# Patient Record
Sex: Male | Born: 1990
Health system: Southern US, Community
[De-identification: ages and names within clinical notes are randomized; demographics above are authoritative.]

## PROBLEM LIST (undated history)

## (undated) HISTORY — PX: WISDOM TOOTH EXTRACTION: SHX21

---
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
## 2014-01-18 ENCOUNTER — Emergency Department: Payer: Self-pay | Admitting: Emergency Medicine

## 2016-05-02 ENCOUNTER — Other Ambulatory Visit: Payer: Self-pay | Admitting: Occupational Medicine

## 2016-05-02 ENCOUNTER — Ambulatory Visit
Admission: RE | Admit: 2016-05-02 | Discharge: 2016-05-02 | Disposition: A | Discharge status: Home / Self Care | Payer: Self-pay | Source: Ambulatory Visit | Attending: Occupational Medicine | Admitting: Occupational Medicine

## 2016-05-02 DIAGNOSIS — Z021 Encounter for pre-employment examination: Secondary | ICD-10-CM

## 2016-05-22 ENCOUNTER — Ambulatory Visit
Admission: RE | Admit: 2016-05-22 | Discharge: 2016-05-22 | Disposition: A | Discharge status: Home / Self Care | Payer: BLUE CROSS/BLUE SHIELD | Source: Ambulatory Visit | Attending: Family Medicine | Admitting: Family Medicine

## 2016-05-22 ENCOUNTER — Other Ambulatory Visit: Payer: Self-pay | Admitting: Family Medicine

## 2016-05-22 DIAGNOSIS — M25532 Pain in left wrist: Secondary | ICD-10-CM

## 2017-02-20 DIAGNOSIS — J358 Other chronic diseases of tonsils and adenoids: Secondary | ICD-10-CM | POA: Diagnosis not present

## 2017-03-13 NOTE — Discharge Instructions (Signed)
T & A INSTRUCTION SHEET - [facility] °[facility] ° °CREIGHTON VAUGHT, MD °PAUL H. JUENGEL, MD  °P. SCOTT BENNETT °CHAPMAN MCQUEEN, MD ° °[address] [place], [place] [address] TEL. [phone] °[address] [place] [address] [phone] ° °INFORMATION SHEET FOR A TONSILLECTOMY AND ADENDOIDECTOMY ° °About Your Tonsils and Adenoids ° The tonsils and adenoids are normal body tissues that are part of our immune system.  They normally help to protect us against diseases that may enter our mouth and nose.  However, sometimes the tonsils and/or adenoids become too large and obstruct our breathing, especially at night. °  ° If either of these things happen it helps to remove the tonsils and adenoids in order to become healthier. The operation to remove the tonsils and adenoids is called a tonsillectomy and adenoidectomy. ° °The Location of Your Tonsils and Adenoids ° The tonsils are located in the back of the throat on both side and sit in a cradle of muscles. The adenoids are located in the roof of the mouth, behind the nose, and closely associated with the opening of the Eustachian tube to the ear. ° °Surgery on Tonsils and Adenoids ° A tonsillectomy and adenoidectomy is a short operation which takes about thirty minutes.  This includes being put to sleep and being awakened.  Tonsillectomies and adenoidectomies are performed at [facility] and may require observation period in the recovery room prior to going home. ° °Following the Operation for a Tonsillectomy ° A cautery machine is used to control bleeding.  Bleeding from a tonsillectomy and adenoidectomy is minimal and postoperatively the risk of bleeding is approximately four percent, although this rarely life threatening. ° ° ° °After your tonsillectomy and adenoidectomy post-op care at home: ° °1. Our patients are able to go home the same day.  You may be given prescriptions for pain  medications and antibiotics, if indicated. °2. It is extremely important to remember that fluid intake is of utmost importance after a tonsillectomy.  The amount that you drink must be maintained in the postoperative period.  A good indication of whether a child is getting enough fluid is whether his/her urine output is constant.  As long as children are urinating or wetting their diaper every 6 - 8 hours this is usually enough fluid intake.   °3. Although rare, this is a risk of some bleeding in the first ten days after surgery.  This is usually occurs between day five and nine postoperatively.  This risk of bleeding is approximately four percent.  If you or your child should have any bleeding you should remain calm and notify our office or go directly to the Emergency Room at [facility] where they will contact us. Our doctors are available seven days a week for notification.  We recommend sitting up quietly in a chair, place an ice pack on the front of the neck and spitting out the blood gently until we are able to contact you.  Adults should gargle gently with ice water and this may help stop the bleeding.  If the bleeding does not stop after a short time, i.e. 10 to 15 minutes, or seems to be increasing again, please contact us or go to the hospital.   °4. It is common for the pain to be worse at 5 - 7 days postoperatively.  This occurs because the “scab” is peeling off and the mucous membrane (skin of   the throat) is growing back where the tonsils were.   °5. It is common for a low-grade fever, less than 102, during the first week after a tonsillectomy and adenoidectomy.  It is usually due to not drinking enough liquids, and we suggest your use liquid Tylenol or the pain medicine with Tylenol prescribed in order to keep your temperature below 102.  Please follow the directions on the back of the bottle. °6. Do not take aspirin or any products that contain aspirin such as Bufferin, Anacin,  Ecotrin, aspirin gum, Goodies, BC headache powders, etc., after a T&A because it can promote bleeding.  Please check with our office before administering any other medication that may been prescribed by other doctors during the two week post-operative period. °7. If you happen to look in the mirror or into your child’s mouth you will see white/gray patches on the back of the throat.  This is what a scab looks like in the mouth and is normal after having a T&A.  It will disappear once the tonsil area heals completely. However, it may cause a noticeable odor, and this too will disappear with time.     °8. You or your child may experience ear pain after having a T&A.  This is called referred pain and comes from the throat, but it is felt in the ears.  Ear pain is quite common and expected.  It will usually go away after ten days.  There is usually nothing wrong with the ears, and it is primarily due to the healing area stimulating the nerve to the ear that runs along the side of the throat.  Use either the prescribed pain medicine or Tylenol as needed.  °9. The throat tissues after a tonsillectomy are obviously sensitive.  Smoking around children who have had a tonsillectomy significantly increases the risk of bleeding.  DO NOT SMOKE!  ° °General Anesthesia, Adult, Care After °These instructions provide you with information about caring for yourself after your procedure. Your health care provider may also give you more specific instructions. Your treatment has been planned according to current medical practices, but problems sometimes occur. Call your health care provider if you have any problems or questions after your procedure. °What can I expect after the procedure? °After the procedure, it is common to have: °· Vomiting. °· A sore throat. °· Mental slowness. ° °It is common to feel: °· Nauseous. °· Cold or shivery. °· Sleepy. °· Tired. °· Sore or achy, even in parts of your body where you did not have  surgery. ° °Follow these instructions at home: °For at least 24 hours after the procedure: °· Do not: °? Participate in activities where you could fall or become injured. °? Drive. °? Use heavy machinery. °? Drink alcohol. °? Take sleeping pills or medicines that cause drowsiness. °? Make important decisions or sign legal documents. °? Take care of children on your own. °· Rest. °Eating and drinking °· If you vomit, drink water, juice, or soup when you can drink without vomiting. °· Drink enough fluid to keep your urine clear or pale yellow. °· Make sure you have little or no nausea before eating solid foods. °· Follow the diet recommended by your health care provider. °General instructions °· Have a responsible adult stay with you until you are awake and alert. °· Return to your normal activities as told by your health care provider. Ask your health care provider what activities are safe for you. °· Take over-the-counter and   prescription medicines only as told by your health care provider. °· If you smoke, do not smoke without supervision. °· Keep all follow-up visits as told by your health care provider. This is important. °Contact a health care provider if: °· You continue to have nausea or vomiting at home, and medicines are not helpful. °· You cannot drink fluids or start eating again. °· You cannot urinate after 8-12 hours. °· You develop a skin rash. °· You have fever. °· You have increasing redness at the site of your procedure. °Get help right away if: °· You have difficulty breathing. °· You have chest pain. °· You have unexpected bleeding. °· You feel that you are having a life-threatening or urgent problem. °This information is not intended to replace advice given to you by your health care provider. Make sure you discuss any questions you have with your health care provider. °Document Released: 09/04/2000 Document Revised: 11/01/2015 Document Reviewed: 05/13/2015 °Elsevier Interactive Patient Education  © 2018 [employer] ° °

## 2017-03-16 ENCOUNTER — Ambulatory Visit
Admission: RE | Admit: 2017-03-16 | Discharge: 2017-03-16 | Disposition: A | Discharge status: Home / Self Care | Payer: 59 | Source: Ambulatory Visit | Attending: Unknown Physician Specialty | Admitting: Unknown Physician Specialty

## 2017-03-16 ENCOUNTER — Ambulatory Visit: Payer: 59 | Admitting: Anesthesiology

## 2017-03-16 ENCOUNTER — Encounter
Admission: RE | Disposition: A | Discharge status: Home / Self Care | Payer: Self-pay | Source: Ambulatory Visit | Attending: Unknown Physician Specialty

## 2017-03-16 DIAGNOSIS — J358 Other chronic diseases of tonsils and adenoids: Secondary | ICD-10-CM | POA: Diagnosis present

## 2017-03-16 DIAGNOSIS — J3501 Chronic tonsillitis: Secondary | ICD-10-CM | POA: Insufficient documentation

## 2017-03-16 DIAGNOSIS — J039 Acute tonsillitis, unspecified: Secondary | ICD-10-CM | POA: Diagnosis not present

## 2017-03-16 DIAGNOSIS — J351 Hypertrophy of tonsils: Secondary | ICD-10-CM | POA: Diagnosis not present

## 2017-03-16 DIAGNOSIS — A4289 Other forms of actinomycosis: Secondary | ICD-10-CM | POA: Diagnosis not present

## 2017-03-16 HISTORY — PX: TONSILLECTOMY: SHX5217

## 2017-03-16 SURGERY — TONSILLECTOMY
Anesthesia: General | Laterality: Bilateral | Wound class: Clean Contaminated

## 2017-03-16 MED ORDER — MIDAZOLAM HCL 2 MG/2ML IJ SOLN
INTRAMUSCULAR | Status: DC | PRN
  Administered 2017-03-16: 2 mg via INTRAVENOUS

## 2017-03-16 MED ORDER — LIDOCAINE HCL (CARDIAC) 20 MG/ML IV SOLN
INTRAVENOUS | Status: DC | PRN
  Administered 2017-03-16: 50 mg via INTRATRACHEAL

## 2017-03-16 MED ORDER — OXYCODONE HCL 5 MG PO TABS
5.0000 mg | ORAL_TABLET | Freq: Once | ORAL | Status: AC | PRN
Start: 2017-03-16 — End: 2017-03-16

## 2017-03-16 MED ORDER — HYDROCODONE-ACETAMINOPHEN 7.5-325 MG/15ML PO SOLN: 15.0000 mL | Freq: Four times a day (QID) | ORAL | 0 refills | Status: DC | PRN

## 2017-03-16 MED ORDER — FENTANYL CITRATE (PF) 100 MCG/2ML IJ SOLN
25.0000 ug | INTRAMUSCULAR | Status: DC | PRN
Start: 2017-03-16 — End: 2017-03-16

## 2017-03-16 MED ORDER — OXYCODONE HCL 5 MG/5ML PO SOLN
5.0000 mg | Freq: Once | ORAL | Status: AC | PRN
  Administered 2017-03-16: 5 mg via ORAL

## 2017-03-16 MED ORDER — PROPOFOL 10 MG/ML IV BOLUS
INTRAVENOUS | Status: DC | PRN
  Administered 2017-03-16: 200 mg via INTRAVENOUS

## 2017-03-16 MED ORDER — LACTATED RINGERS IV SOLN
INTRAVENOUS | Status: DC
  Administered 2017-03-16: 09:00:00 via INTRAVENOUS

## 2017-03-16 MED ORDER — SCOPOLAMINE 1 MG/3DAYS TD PT72
1.0000 | MEDICATED_PATCH | TRANSDERMAL | Status: DC
  Administered 2017-03-16: 1.5 mg via TRANSDERMAL

## 2017-03-16 MED ORDER — ACETAMINOPHEN 10 MG/ML IV SOLN
1000.0000 mg | Freq: Four times a day (QID) | INTRAVENOUS | Status: DC
  Administered 2017-03-16: 1000 mg via INTRAVENOUS

## 2017-03-16 MED ORDER — FENTANYL CITRATE (PF) 100 MCG/2ML IJ SOLN
INTRAMUSCULAR | Status: DC | PRN
  Administered 2017-03-16: 100 ug via INTRAVENOUS

## 2017-03-16 MED ORDER — DEXAMETHASONE SODIUM PHOSPHATE 4 MG/ML IJ SOLN
INTRAMUSCULAR | Status: DC | PRN
  Administered 2017-03-16: 10 mg via INTRAVENOUS

## 2017-03-16 MED ORDER — SUCCINYLCHOLINE CHLORIDE 20 MG/ML IJ SOLN
INTRAMUSCULAR | Status: DC | PRN
  Administered 2017-03-16: 100 mg via INTRAVENOUS

## 2017-03-16 MED ORDER — BUPIVACAINE HCL (PF) 0.5 % IJ SOLN
INTRAMUSCULAR | Status: DC | PRN
  Administered 2017-03-16: 9 mL

## 2017-03-16 MED ORDER — ONDANSETRON HCL 4 MG/2ML IJ SOLN
INTRAMUSCULAR | Status: DC | PRN
  Administered 2017-03-16: 4 mg via INTRAVENOUS

## 2017-03-16 SURGICAL SUPPLY — 17 items
CANISTER SUCT 1200ML W/VALVE (MISCELLANEOUS) ×2 IMPLANT
COAG SUCT 10F 3.5MM HAND CTRL (MISCELLANEOUS) ×2 IMPLANT
DRAPE HEAD BAR (DRAPES) ×2 IMPLANT
ELECT CAUTERY BLADE TIP 2.5 (TIP) ×2
ELECTRODE CAUTERY BLDE TIP 2.5 (TIP) ×1 IMPLANT
GLOVE BIO SURGEON STRL SZ7.5 (GLOVE) ×2 IMPLANT
HANDLE SUCTION POOLE (INSTRUMENTS) ×1 IMPLANT
KIT ROOM TURNOVER OR (KITS) ×2 IMPLANT
NEEDLE HYPO 25GX1X1/2 BEV (NEEDLE) ×2 IMPLANT
NS IRRIG 500ML POUR BTL (IV SOLUTION) ×2 IMPLANT
PACK TONSIL/ADENOIDS (PACKS) ×2 IMPLANT
PAD GROUND ADULT SPLIT (MISCELLANEOUS) ×2 IMPLANT
PENCIL ELECTRO HAND CTR (MISCELLANEOUS) ×2 IMPLANT
STRAP BODY AND KNEE 60X3 (MISCELLANEOUS) ×2 IMPLANT
SUCTION POOLE HANDLE (INSTRUMENTS) ×2
SYR 5ML LL (SYRINGE) ×2 IMPLANT
SYRINGE 10CC LL (SYRINGE) IMPLANT

## 2017-03-16 NOTE — Transfer of Care (Signed)
Immediate Anesthesia Transfer of Care Note  Patient: Andrew Montoya  Procedure(s) Performed: TONSILLECTOMY (Bilateral )  Patient Location: PACU  Anesthesia Type: General ETT  Level of Consciousness: awake, alert  and patient cooperative  Airway and Oxygen Therapy: Patient Spontanous Breathing and Patient connected to supplemental oxygen  Post-op Assessment: Post-op Vital signs reviewed, Patient's Cardiovascular Status Stable, Respiratory Function Stable, Patent Airway and No signs of Nausea or vomiting  Post-op Vital Signs: Reviewed and stable  Complications: No apparent anesthesia complications

## 2017-03-16 NOTE — Anesthesia Preprocedure Evaluation (Signed)
Anesthesia Evaluation  Patient identified by MRN, date of birth, ID band Patient awake    Airway Mallampati: I  TM Distance: >3 FB Neck ROM: full    Dental no notable dental hx.    Pulmonary former smoker,    Pulmonary exam normal        Cardiovascular negative cardio ROS Normal cardiovascular exam     Neuro/Psych    GI/Hepatic negative GI ROS, Neg liver ROS,   Endo/Other  negative endocrine ROS  Renal/GU negative Renal ROS     Musculoskeletal   Abdominal   Peds  Hematology negative hematology ROS (+)   Anesthesia Other Findings   Reproductive/Obstetrics                            Anesthesia Physical Anesthesia Plan  ASA: I  Anesthesia Plan: General ETT   Post-op Pain Management:    Induction:   PONV Risk Score and Plan:   Airway Management Planned:   Additional Equipment:   Intra-op Plan:   Post-operative Plan:   Informed Consent: I have reviewed the patients History and Physical, chart, labs and discussed the procedure including the risks, benefits and alternatives for the proposed anesthesia with the patient or authorized representative who has indicated his/her understanding and acceptance.     Plan Discussed with:   Anesthesia Plan Comments:         Anesthesia Quick Evaluation

## 2017-03-16 NOTE — H&P (Signed)
The patient's history has been reviewed, patient examined, no change in status, stable for surgery.  Questions were answered to the patients satisfaction.  

## 2017-03-16 NOTE — Anesthesia Postprocedure Evaluation (Signed)
Anesthesia Post Note  Patient: Andrew Montoya  Procedure(s) Performed: TONSILLECTOMY (Bilateral )  Patient location during evaluation: PACU Anesthesia Type: General Level of consciousness: awake and alert Pain management: pain level controlled Vital Signs Assessment: post-procedure vital signs reviewed and stable Respiratory status: spontaneous breathing Cardiovascular status: blood pressure returned to baseline Postop Assessment: no headache Anesthetic complications: no    Jaci Standard, III,  Romualdo Prosise D

## 2017-03-16 NOTE — Anesthesia Procedure Notes (Signed)
Procedure Name: Intubation Date/Time: 03/16/2017 9:54 AM Performed by: Londell Moh Pre-anesthesia Checklist: Patient identified, Emergency Drugs available, Suction available, Patient being monitored and Timeout performed Patient Re-evaluated:Patient Re-evaluated prior to induction Oxygen Delivery Method: Circle system utilized Preoxygenation: Pre-oxygenation with 100% oxygen Induction Type: IV induction Ventilation: Mask ventilation without difficulty Laryngoscope Size: Mac and 3 Grade View: Grade I Tube type: Oral Rae Tube size: 7.5 mm Number of attempts: 1 Placement Confirmation: ETT inserted through vocal cords under direct vision,  positive ETCO2 and breath sounds checked- equal and bilateral Tube secured with: Tape Dental Injury: Teeth and Oropharynx as per pre-operative assessment

## 2017-03-16 NOTE — Op Note (Signed)
PREOPERATIVE DIAGNOSIS:  TONSILLOLITHIASIS  TONSILLITIS  POSTOPERATIVE DIAGNOSIS:  Chronic Tonsillitis  OPERATION:  Tonsillectomy.  SURGEON:  Roena Malady, MD  ANESTHESIA:  General endotracheal.  OPERATIVE FINDINGS:  Large tonsils.  DESCRIPTION OF THE PROCEDURE: AESON SAWYERS was identified in the holding area and taken to the operating room and placed in the supine position.  After general endotracheal anesthesia, the table was turned 45 degrees and the patient was draped in the usual fashion for a tonsillectomy.  A mouth gag was inserted into the oral cavity.  There were large tonsils.  Beginning on the left-hand side a tenaculum was used to grasp the tonsil and the Bovie cautery was used to dissect it free from the fossa.  In a similar fashion, the right tonsil was removed.  Meticulous hemostasis was achieved using the Bovie cautery.  With both tonsils removed and no active bleeding, 0.5% plain Marcaine was used to inject the anterior and posterior tonsillar pillars bilaterally.  A total of 67ml was used.  The patient tolerated the procedure well and was awakened in the operating room and taken to the recovery room in stable condition.   CULTURES:  None.  SPECIMENS:  Tonsils.  ESTIMATED BLOOD LOSS:  Less than 10 ml.  Andrew Montoya T  03/16/2017  10:11 AM

## 2017-03-20 LAB — SURGICAL PATHOLOGY

## 2017-11-08 ENCOUNTER — Ambulatory Visit: Payer: 59 | Admitting: Physician Assistant

## 2017-11-08 ENCOUNTER — Ambulatory Visit
Admission: RE | Admit: 2017-11-08 | Discharge: 2017-11-08 | Disposition: A | Discharge status: Home / Self Care | Payer: 59 | Source: Ambulatory Visit | Attending: Physician Assistant | Admitting: Physician Assistant

## 2017-11-08 ENCOUNTER — Encounter: Payer: Self-pay | Admitting: Physician Assistant

## 2017-11-08 VITALS — BP 116/80 | HR 76 | Temp 97.9°F | Resp 16 | Ht 70.0 in | Wt 216.0 lb

## 2017-11-08 DIAGNOSIS — M79672 Pain in left foot: Secondary | ICD-10-CM | POA: Insufficient documentation

## 2017-11-08 DIAGNOSIS — M25562 Pain in left knee: Secondary | ICD-10-CM

## 2017-11-08 DIAGNOSIS — M7541 Impingement syndrome of right shoulder: Secondary | ICD-10-CM | POA: Diagnosis not present

## 2017-11-08 MED ORDER — METHYLPREDNISOLONE 4 MG PO TBPK: ORAL_TABLET | ORAL | 0 refills | Status: DC

## 2017-11-08 NOTE — Progress Notes (Signed)
Patient: Andrew Montoya, Male    DOB: 1991/05/23, 27 y.o.   MRN: [id number] Visit Date: 11/08/2017  Today's Provider: Mar Daring, PA-C   Chief Complaint  Patient presents with  . New Patient (Initial Visit)   Subjective:   Patient here to establish care. Patient reports that he was been seen by the [facility] doctor.   Andrew Montoya is a 27 y.o. male who presents today for establishing careyHe feels well. He reports exercising 5 days a week. He reports he is sleeping well. 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 He does still live locally with his girlfriend. No children.   He has mulitple complaints today. He complains of right shoulder pain, mostly with overhead activities. No known injury.  He also has pain of his right wrist with wrist extension. No other movements bother him. No known injury.   He also has pain in his left knee and left foot. These are the most bothersome. Again no known injury noted.  -----------------------------------------------------------------   Review of Systems  Constitutional: Negative.   HENT: Positive for sinus pressure.   Eyes: Negative.   Respiratory: Negative.   Cardiovascular: Negative.   Gastrointestinal: Negative.   Endocrine: Negative.   Genitourinary: Negative.   Musculoskeletal: Positive for arthralgias and myalgias.  Skin: Negative.   Allergic/Immunologic: Negative.   Neurological: Negative.   Hematological: Negative.   Psychiatric/Behavioral: Negative.     Social History      He  reports that he has never smoked. He quit smokeless tobacco use about 7 months ago. He reports that he drinks about 1.2 oz of alcohol per week.       Social History   Socioeconomic History  . Marital status: Single    Spouse name: Not on file  . Number of children: Not on file  . Years of education: Not on file  . Highest education level: Not on file  Occupational History  . Not on file  Social Needs  .  Financial resource strain: Not on file  . Food insecurity:    Worry: Not on file    Inability: Not on file  . Transportation needs:    Medical: Not on file    Non-medical: Not on file  Tobacco Use  . Smoking status: Never Smoker  . Smokeless tobacco: Former Network engineer and Sexual Activity  . Alcohol use: Yes    Alcohol/week: 1.2 oz    Types: 2 Cans of beer per week    Comment: 2-3x week  . Drug use: Not on file  . Sexual activity: Not on file  Lifestyle  . Physical activity:    Days per week: Not on file    Minutes per session: Not on file  . Stress: Not on file  Relationships  . Social connections:    Talks on phone: Not on file    Gets together: Not on file    Attends religious service: Not on file    Active member of club or organization: Not on file    Attends meetings of clubs or organizations: Not on file    Relationship status: Not on file  Other Topics Concern  . Not on file  Social History Narrative  . Not on file    No past medical history on file.   There are no active problems to display for this patient.   Past Surgical History:  Procedure Laterality Date  . TONSILLECTOMY Bilateral  03/16/2017   Procedure: TONSILLECTOMY;  Surgeon: Beverly Gust, MD;  Location: [place];  Service: ENT;  Laterality: Bilateral;  . WISDOM TOOTH EXTRACTION      Family History        Family Status  Relation Name Status  . Mother  Alive  . Father  Alive  . PGM  Alive  . PGF  Alive  . Sister  Alive  . Brother  Alive        His family history includes Diabetes in his paternal grandmother; Epilepsy in his brother; Hypertension in his father and paternal grandfather.      No Known Allergies   Current Outpatient Medications:  Marland Kitchen  Multiple Vitamins-Minerals (MENS MULTIVITAMIN PLUS PO), Take by mouth., Disp: , Rfl:    Patient Care Team: Karen Kitchens, MD as PCP - General (Family Medicine)      Objective:   Vitals: BP 116/80 (BP Location:  Left Arm, Patient Position: Sitting, Cuff Size: Large)   Pulse 76   Temp 97.9 F (36.6 C) (Oral)   Resp 16   Ht 5\' 10"  (1.778 m)   Wt 216 lb (98 kg)   SpO2 97%   BMI 30.99 kg/m    Vitals:   11/08/17 1413  BP: 116/80  Pulse: 76  Resp: 16  Temp: 97.9 F (36.6 C)  TempSrc: Oral  SpO2: 97%  Weight: 216 lb (98 kg)  Height: 5\' 10"  (1.778 m)     Physical Exam  Constitutional: He appears well-developed and well-nourished. No distress.  HENT:  Head: Normocephalic and atraumatic.  Neck: Normal range of motion. Neck supple. No tracheal deviation present.  Cardiovascular: Normal rate, regular rhythm and normal heart sounds. Exam reveals no gallop and no friction rub.  No murmur heard. Pulmonary/Chest: Effort normal and breath sounds normal. No respiratory distress. He has no wheezes. He has no rales.  Musculoskeletal:       Right shoulder: He exhibits crepitus and pain. He exhibits normal range of motion, no tenderness, no spasm, normal pulse and normal strength.       Right wrist: He exhibits normal range of motion, no tenderness, no bony tenderness, no crepitus and no deformity.       Left knee: He exhibits normal range of motion, no swelling, no effusion, no erythema, normal alignment, no LCL laxity, normal patellar mobility, no bony tenderness, normal meniscus and no MCL laxity. No tenderness found.       Left ankle: Normal.       Left foot: There is bony tenderness. There is normal range of motion, no swelling and normal capillary refill.  Pain only with AROM of the right shoulder. Most with abduction of the right shoulder and IR.  Right wrist only pain with AROM of wrist extension. Most notably over the extensor tendons.  Pain of left knee could not be recreated. Possible Baker Cyst as pain is located in the popliteal region  Left foot pain with weight bearing most notably and point tender over the cuboid bone  Skin: He is not diaphoretic.  Vitals reviewed.    Depression  Screen PHQ 2/9 Scores 11/08/2017  PHQ - 2 Score 1  PHQ- 9 Score 4      Assessment & Plan:     Routine Health Maintenance and Physical Exam  Exercise Activities and Dietary recommendations Goals    None       There is no immunization history on file for this patient.  Health Maintenance  Topic  Date Due  . HIV Screening  03/06/2006  . TETANUS/TDAP  03/06/2010  . INFLUENZA VACCINE  01/10/2018     Discussed health benefits of physical activity, and encouraged him to engage in regular exercise appropriate for his age and condition.    1. Acute pain of left knee Will obtain imaging as below to r/o bony abnormality and see if baker cyst may be visualized. Medrol dose pak given as below. Advised to brace knee, ice after activity, heat and stretch. Call if no improvements.  - DG Knee Complete 4 Views Left; Future - methylPREDNISolone (MEDROL) 4 MG TBPK tablet; 6 day taper; take as directed on package instructions  Dispense: 21 tablet; Refill: 0  2. Left foot pain Will get imaging as below to r/o possible stress fracture. 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 Again will use medrol dose pak as noted previously. - DG Foot Complete Left; Future - methylPREDNISolone (MEDROL) 4 MG TBPK tablet; 6 day taper; take as directed on package instructions  Dispense: 21 tablet; Refill: 0  3. Impingement syndrome of right shoulder Exercises printed for impingement syndrome. Medrol given as below.  - methylPREDNISolone (MEDROL) 4 MG TBPK tablet; 6 day taper; take as directed on package instructions  Dispense: 21 tablet; Refill: 0  --------------------------------------------------------------------    Mar Daring, PA-C  [facility]

## 2017-11-08 NOTE — Patient Instructions (Signed)
Shoulder Impingement Syndrome Shoulder impingement syndrome is a condition that causes pain when connective tissues (tendons) surrounding the shoulder joint become pinched. These tendons are part of the group of muscles and tissues that help to stabilize the shoulder (rotator cuff). Beneath the rotator cuff is a fluid-filled sac (bursa) that allows the muscles and tendons to glide smoothly. The bursa may become swollen or irritated (bursitis). Bursitis, swelling in the rotator cuff tendons, or both conditions can decrease how much space is under a bone in the shoulder joint (acromion), resulting in impingement. What are the causes? Shoulder impingement syndrome can be caused by bursitis or swelling of the rotator cuff tendons, which may result from:  Repetitive overhead arm movements.  Falling onto the shoulder.  Weakness in the shoulder muscles.  What increases the risk? You may be more likely to develop this condition if you are an athlete who participates in:  Sports that involve throwing, such as baseball.  Tennis.  Swimming.  Volleyball.  Some people are also more likely to develop impingement syndrome because of the shape of their acromion bone. What are the signs or symptoms? The main symptom of this condition is pain on the front or side of the shoulder. Pain may:  Get worse when lifting or raising the arm.  Get worse at night.  Wake you up from sleeping.  Feel sharp when the shoulder is moved, and then fade to an ache.  Other signs and symptoms may include:  Tenderness.  Stiffness.  Inability to raise the arm above shoulder level or behind the body.  Weakness.  How is this diagnosed? This condition may be diagnosed based on:  Your symptoms.  Your medical history.  A physical exam.  Imaging tests, such as: ? X-rays. ? MRI. ? Ultrasound.  How is this treated? Treatment for this condition may include:  Resting your shoulder and avoiding all  activities that cause pain or put stress on the shoulder.  Icing your shoulder.  NSAIDs to help reduce pain and swelling.  One or more injections of medicines to numb the area and reduce inflammation.  Physical therapy.  Surgery. This may be needed if nonsurgical treatments have not helped. Surgery may involve repairing the rotator cuff, reshaping the acromion, or removing the bursa.  Follow these instructions at home: Managing pain, stiffness, and swelling  If directed, apply ice to the injured area. ? Put ice in a plastic bag. ? Place a towel between your skin and the bag. ? Leave the ice on for 20 minutes, 2-3 times a day. Activity  Rest and return to your normal activities as told by your health care provider. Ask your health care provider what activities are safe for you.  Do exercises as told by your health care provider. General instructions  Do not use any tobacco products, including cigarettes, chewing tobacco, or e-cigarettes. Tobacco can delay healing. If you need help quitting, ask your health care provider.  Ask your health care provider when it is safe for you to drive.  Take over-the-counter and prescription medicines only as told by your health care provider.  Keep all follow-up visits as told by your health care provider. This is important. How is this prevented?  Give your body time to rest between periods of activity.  Be safe and responsible while being active to avoid falls.  Maintain physical fitness, including strength and flexibility. Contact a health care provider if:  Your symptoms have not improved after 1-2 months of treatment and   rest.  You cannot lift your arm away from your body. This information is not intended to replace advice given to you by your health care provider. Make sure you discuss any questions you have with your health care provider. Document Released: 05/29/2005 Document Revised: 02/03/2016 Document Reviewed:  05/01/2015 Elsevier Interactive Patient Education  2018 [employer]  Shoulder Exercises Ask your health care provider which exercises are safe for you. Do exercises exactly as told by your health care provider and adjust them as directed. It is normal to feel mild stretching, pulling, tightness, or discomfort as you do these exercises, but you should stop right away if you feel sudden pain or your pain gets worse.Do not begin these exercises until told by your health care provider. RANGE OF MOTION EXERCISES These exercises warm up your muscles and joints and improve the movement and flexibility of your shoulder. These exercises also help to relieve pain, numbness, and tingling. These exercises involve stretching your injured shoulder directly. Exercise A: Pendulum  1. Stand near a wall or a surface that you can hold onto for balance. 2. Bend at the waist and let your left / right arm hang straight down. Use your other arm to support you. Keep your back straight and do not lock your knees. 3. Relax your left / right arm and shoulder muscles, and move your hips and your trunk so your left / right arm swings freely. Your arm should swing because of the motion of your body, not because you are using your arm or shoulder muscles. 4. Keep moving your body so your arm swings in the following directions, as told by your health care provider: ? Side to side. ? Forward and backward. ? In clockwise and counterclockwise circles. 5. Continue each motion for __________ seconds, or for as long as told by your health care provider. 6. Slowly return to the starting position. Repeat __________ times. Complete this exercise __________ times a day. Exercise B:Flexion, Standing  1. Stand and hold a broomstick, a cane, or a similar object. Place your hands a little more than shoulder-width apart on the object. Your left / right hand should be palm-up, and your other hand should be palm-down. 2. Keep your elbow  straight and keep your shoulder muscles relaxed. Push the stick down with your healthy arm to raise your left / right arm in front of your body, and then over your head until you feel a stretch in your shoulder. ? Avoid shrugging your shoulder while you raise your arm. Keep your shoulder blade tucked down toward the middle of your back. 3. Hold for __________ seconds. 4. Slowly return to the starting position. Repeat __________ times. Complete this exercise __________ times a day. Exercise C: Abduction, Standing 1. Stand and hold a broomstick, a cane, or a similar object. Place your hands a little more than shoulder-width apart on the object. Your left / right hand should be palm-up, and your other hand should be palm-down. 2. While keeping your elbow straight and your shoulder muscles relaxed, push the stick across your body toward your left / right side. Raise your left / right arm to the side of your body and then over your head until you feel a stretch in your shoulder. ? Do not raise your arm above shoulder height, unless your health care provider tells you to do that. ? Avoid shrugging your shoulder while you raise your arm. Keep your shoulder blade tucked down toward the middle of your back. 3.   Hold for __________ seconds. 4. Slowly return to the starting position. Repeat __________ times. Complete this exercise __________ times a day. Exercise D:Internal Rotation  1. Place your left / right hand behind your back, palm-up. 2. Use your other hand to dangle an exercise band, a towel, or a similar object over your shoulder. Grasp the band with your left / right hand so you are holding onto both ends. 3. Gently pull up on the band until you feel a stretch in the front of your left / right shoulder. ? Avoid shrugging your shoulder while you raise your arm. Keep your shoulder blade tucked down toward the middle of your back. 4. Hold for __________ seconds. 5. Release the stretch by letting go  of the band and lowering your hands. Repeat __________ times. Complete this exercise __________ times a day. STRETCHING EXERCISES These exercises warm up your muscles and joints and improve the movement and flexibility of your shoulder. These exercises also help to relieve pain, numbness, and tingling. These exercises are done using your healthy shoulder to help stretch the muscles of your injured shoulder. Exercise E: Corner Stretch (External Rotation and Abduction)  1. Stand in a doorway with one of your feet slightly in front of the other. This is called a staggered stance. If you cannot reach your forearms to the door frame, stand facing a corner of a room. 2. Choose one of the following positions as told by your health care provider: ? Place your hands and forearms on the door frame above your head. ? Place your hands and forearms on the door frame at the height of your head. ? Place your hands on the door frame at the height of your elbows. 3. Slowly move your weight onto your front foot until you feel a stretch across your chest and in the front of your shoulders. Keep your head and chest upright and keep your abdominal muscles tight. 4. Hold for __________ seconds. 5. To release the stretch, shift your weight to your back foot. Repeat __________ times. Complete this stretch __________ times a day. Exercise F:Extension, Standing 1. Stand and hold a broomstick, a cane, or a similar object behind your back. ? Your hands should be a little wider than shoulder-width apart. ? Your palms should face away from your back. 2. Keeping your elbows straight and keeping your shoulder muscles relaxed, move the stick away from your body until you feel a stretch in your shoulder. ? Avoid shrugging your shoulders while you move the stick. Keep your shoulder blade tucked down toward the middle of your back. 3. Hold for __________ seconds. 4. Slowly return to the starting position. Repeat __________  times. Complete this exercise __________ times a day. STRENGTHENING EXERCISES These exercises build strength and endurance in your shoulder. Endurance is the ability to use your muscles for a long time, even after they get tired. Exercise G:External Rotation  1. Sit in a stable chair without armrests. 2. Secure an exercise band at elbow height on your left / right side. 3. Place a soft object, such as a folded towel or a small pillow, between your left / right upper arm and your body to move your elbow a few inches away (about 10 cm) from your side. 4. Hold the end of the band so it is tight and there is no slack. 5. Keeping your elbow pressed against the soft object, move your left / right forearm out, away from your abdomen. Keep your body steady so   only your forearm moves. 6. Hold for __________ seconds. 7. Slowly return to the starting position. Repeat __________ times. Complete this exercise __________ times a day. Exercise H:Shoulder Abduction  1. Sit in a stable chair without armrests, or stand. 2. Hold a __________ weight in your left / right hand, or hold an exercise band with both hands. 3. Start with your arms straight down and your left / right palm facing in, toward your body. 4. Slowly lift your left / right hand out to your side. Do not lift your hand above shoulder height unless your health care provider tells you that this is safe. ? Keep your arms straight. ? Avoid shrugging your shoulder while you do this movement. Keep your shoulder blade tucked down toward the middle of your back. 5. Hold for __________ seconds. 6. Slowly lower your arm, and return to the starting position. Repeat __________ times. Complete this exercise __________ times a day. Exercise I:Shoulder Extension 1. Sit in a stable chair without armrests, or stand. 2. Secure an exercise band to a stable object in front of you where it is at shoulder height. 3. Hold one end of the exercise band in each  hand. Your palms should face each other. 4. Straighten your elbows and lift your hands up to shoulder height. 5. Step back, away from the secured end of the exercise band, until the band is tight and there is no slack. 6. Squeeze your shoulder blades together as you pull your hands down to the sides of your thighs. Stop when your hands are straight down by your sides. Do not let your hands go behind your body. 7. Hold for __________ seconds. 8. Slowly return to the starting position. Repeat __________ times. Complete this exercise __________ times a day. Exercise J:Standing Shoulder Row 1. Sit in a stable chair without armrests, or stand. 2. Secure an exercise band to a stable object in front of you so it is at waist height. 3. Hold one end of the exercise band in each hand. Your palms should be in a thumbs-up position. 4. Bend each of your elbows to an "L" shape (about 90 degrees) and keep your upper arms at your sides. 5. Step back until the band is tight and there is no slack. 6. Slowly pull your elbows back behind you. 7. Hold for __________ seconds. 8. Slowly return to the starting position. Repeat __________ times. Complete this exercise __________ times a day. Exercise K:Shoulder Press-Ups  1. Sit in a stable chair that has armrests. Sit upright, with your feet flat on the floor. 2. Put your hands on the armrests so your elbows are bent and your fingers are pointing forward. Your hands should be about even with the sides of your body. 3. Push down on the armrests and use your arms to lift yourself off of the chair. Straighten your elbows and lift yourself up as much as you comfortably can. ? Move your shoulder blades down, and avoid letting your shoulders move up toward your ears. ? Keep your feet on the ground. As you get stronger, your feet should support less of your body weight as you lift yourself up. 4. Hold for __________ seconds. 5. Slowly lower yourself back into the  chair. Repeat __________ times. Complete this exercise __________ times a day. Exercise L: Wall Push-Ups  1. Stand so you are facing a stable wall. Your feet should be about one arm-length away from the wall. 2. Lean forward and place your palms on   the wall at shoulder height. 3. Keep your feet flat on the floor as you bend your elbows and lean forward toward the wall. 4. Hold for __________ seconds. 5. Straighten your elbows to push yourself back to the starting position. Repeat __________ times. Complete this exercise __________ times a day. This information is not intended to replace advice given to you by your health care provider. Make sure you discuss any questions you have with your health care provider. Document Released: 04/12/2005 Document Revised: 02/21/2016 Document Reviewed: 02/07/2015 Elsevier Interactive Patient Education  2018 [employer] Inc.  

## 2017-11-09 ENCOUNTER — Telehealth: Payer: Self-pay

## 2017-11-09 NOTE — Telephone Encounter (Signed)
-----   Message from Mar Daring, PA-C sent at 11/09/2017 10:07 AM EDT ----- No stress fracture or injury noted of left foot.

## 2017-11-09 NOTE — Telephone Encounter (Signed)
Patient advised as below.  

## 2017-11-09 NOTE — Telephone Encounter (Signed)
-----   Message from Mar Daring, [place] sent at 11/09/2017 10:06 AM EDT ----- Knee xray is normal. No bony abnormality noted. No Baker Cyst documented.

## 2017-12-07 ENCOUNTER — Ambulatory Visit: Payer: 59 | Admitting: Physician Assistant

## 2018-03-11 ENCOUNTER — Encounter: Payer: Self-pay | Admitting: Physician Assistant

## 2018-03-11 DIAGNOSIS — M25562 Pain in left knee: Principal | ICD-10-CM

## 2018-03-11 DIAGNOSIS — G8929 Other chronic pain: Secondary | ICD-10-CM

## 2018-03-12 NOTE — Addendum Note (Signed)
Addended by: Mar Daring on: 03/12/2018 01:54 PM   Modules accepted: Orders

## 2018-03-25 DIAGNOSIS — M25562 Pain in left knee: Secondary | ICD-10-CM | POA: Diagnosis not present

## 2018-04-01 DIAGNOSIS — M25562 Pain in left knee: Secondary | ICD-10-CM | POA: Diagnosis not present

## 2018-04-03 DIAGNOSIS — M25562 Pain in left knee: Secondary | ICD-10-CM | POA: Diagnosis not present

## 2018-07-03 DIAGNOSIS — J101 Influenza due to other identified influenza virus with other respiratory manifestations: Secondary | ICD-10-CM | POA: Diagnosis not present

## 2018-07-03 DIAGNOSIS — R509 Fever, unspecified: Secondary | ICD-10-CM | POA: Diagnosis not present

## 2018-09-12 IMAGING — CR DG CHEST 1V
1 series · 1 of 1 positions shown · non-contrast
Comparison: None.

CLINICAL DATA: 25-year-old pre-employment examination.  Nonsmoker.

EXAM:
CHEST 1 VIEW

[w chest pa]
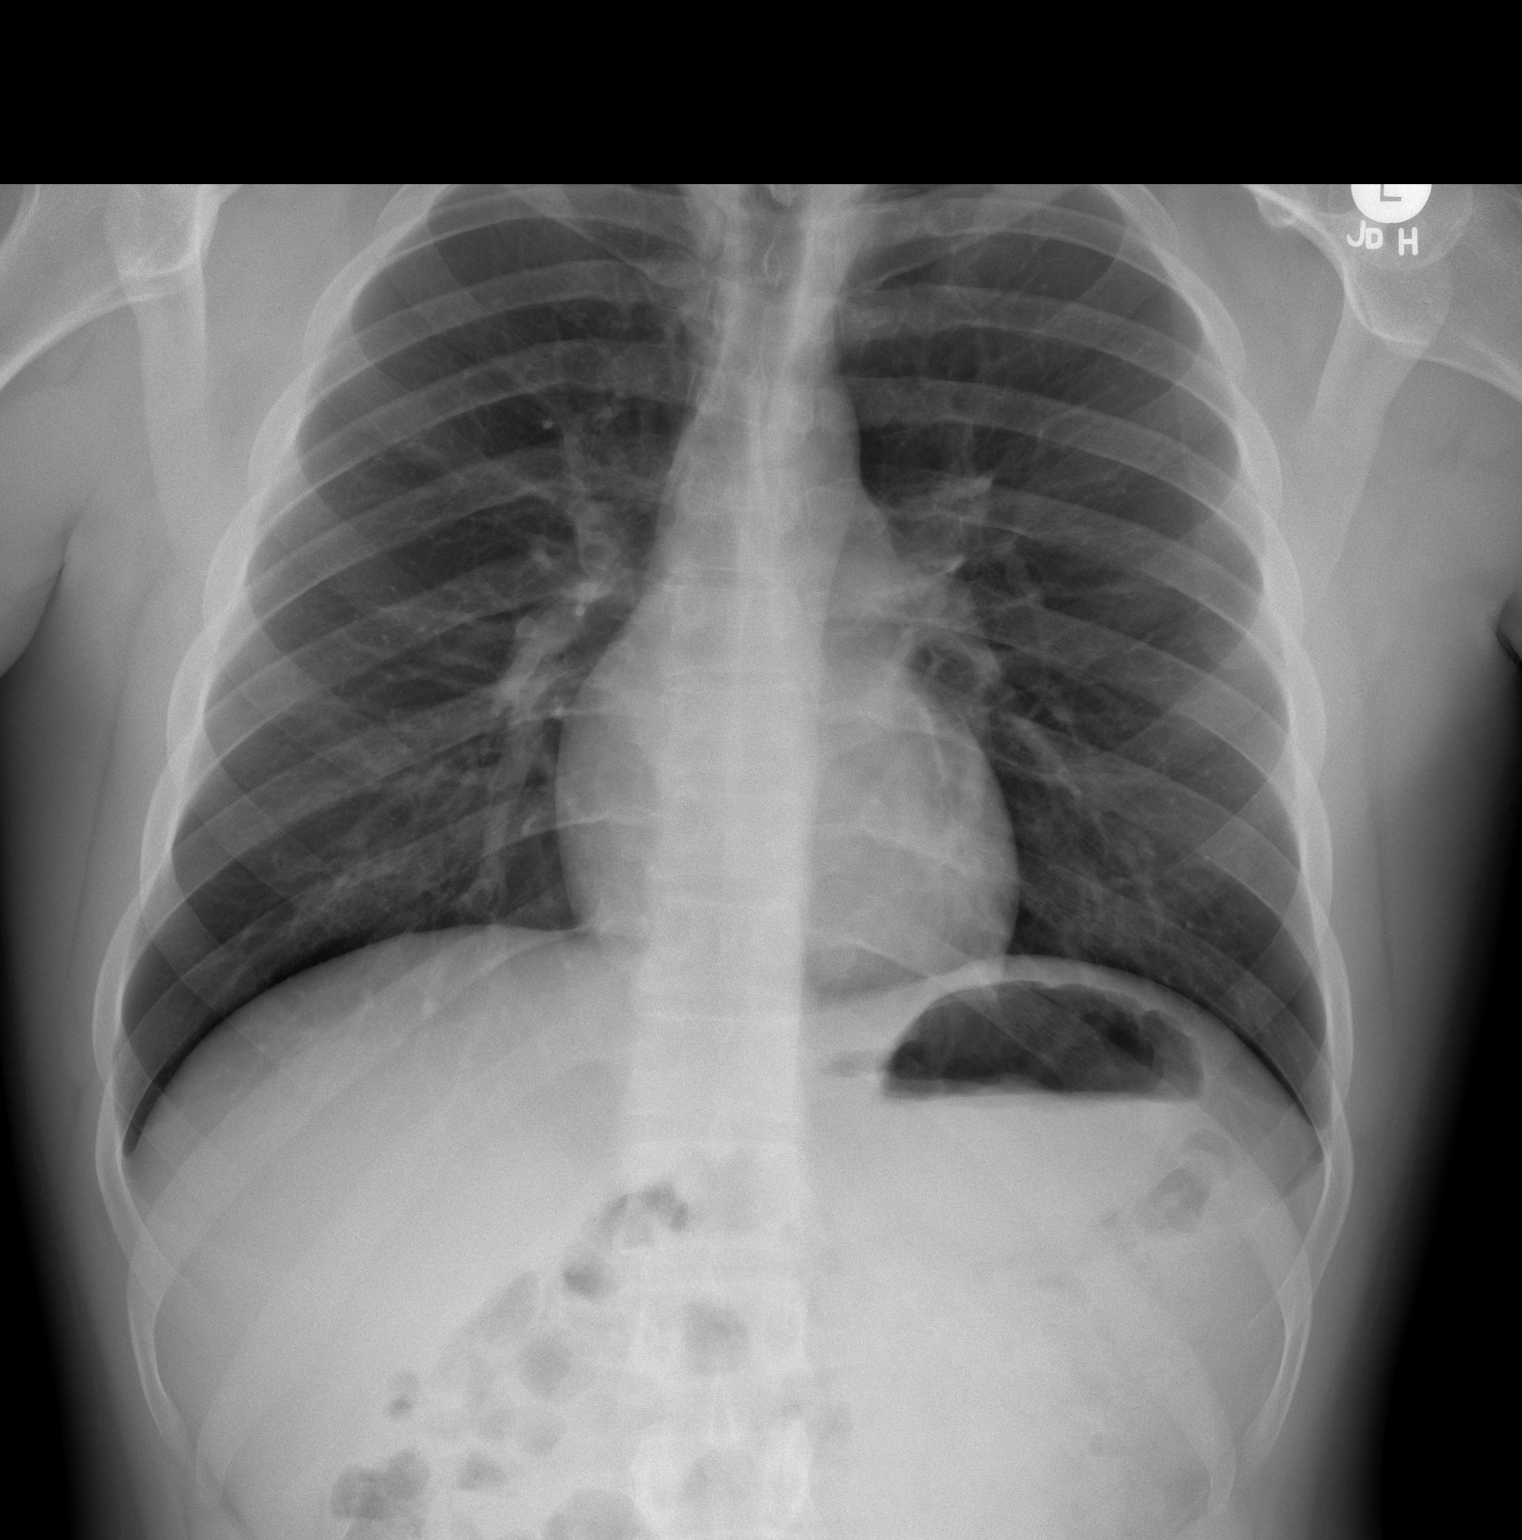

[1 of 1 positions shown; findings below may reference images not displayed]

FINDINGS: Cardiomediastinal silhouette unremarkable. Lungs clear.
Bronchovascular markings normal. Pulmonary vascularity normal. No
visible pleural effusions. No pneumothorax.
IMPRESSION: Normal examination.

## 2018-10-02 IMAGING — CR DG WRIST COMPLETE 3+V*L*
4 series · 4 of 4 positions shown · non-contrast
Comparison: None in PACs

CLINICAL DATA: Status post fall onto the left hand 3 months ago
while playing baseball. The patient has persistent pain when
applying pressure to the heel of the hand along the palm are aspect
of the wrist.

EXAM:
LEFT WRIST - COMPLETE 3+ VIEW

[wrist pa]
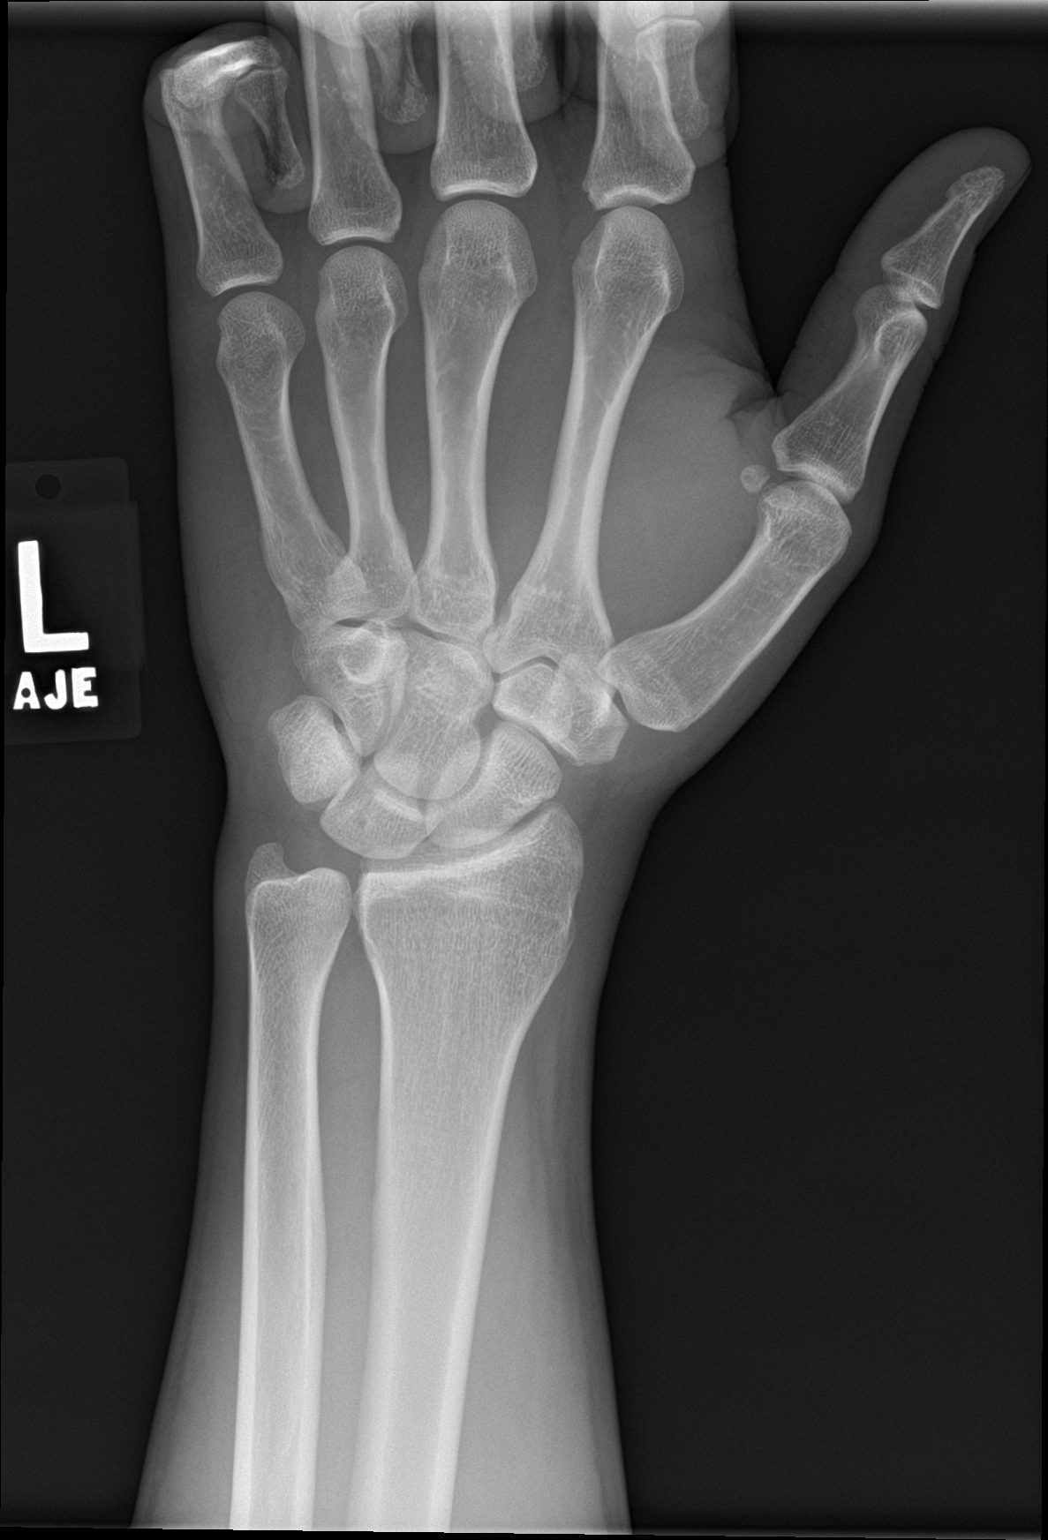

[wrist obl]
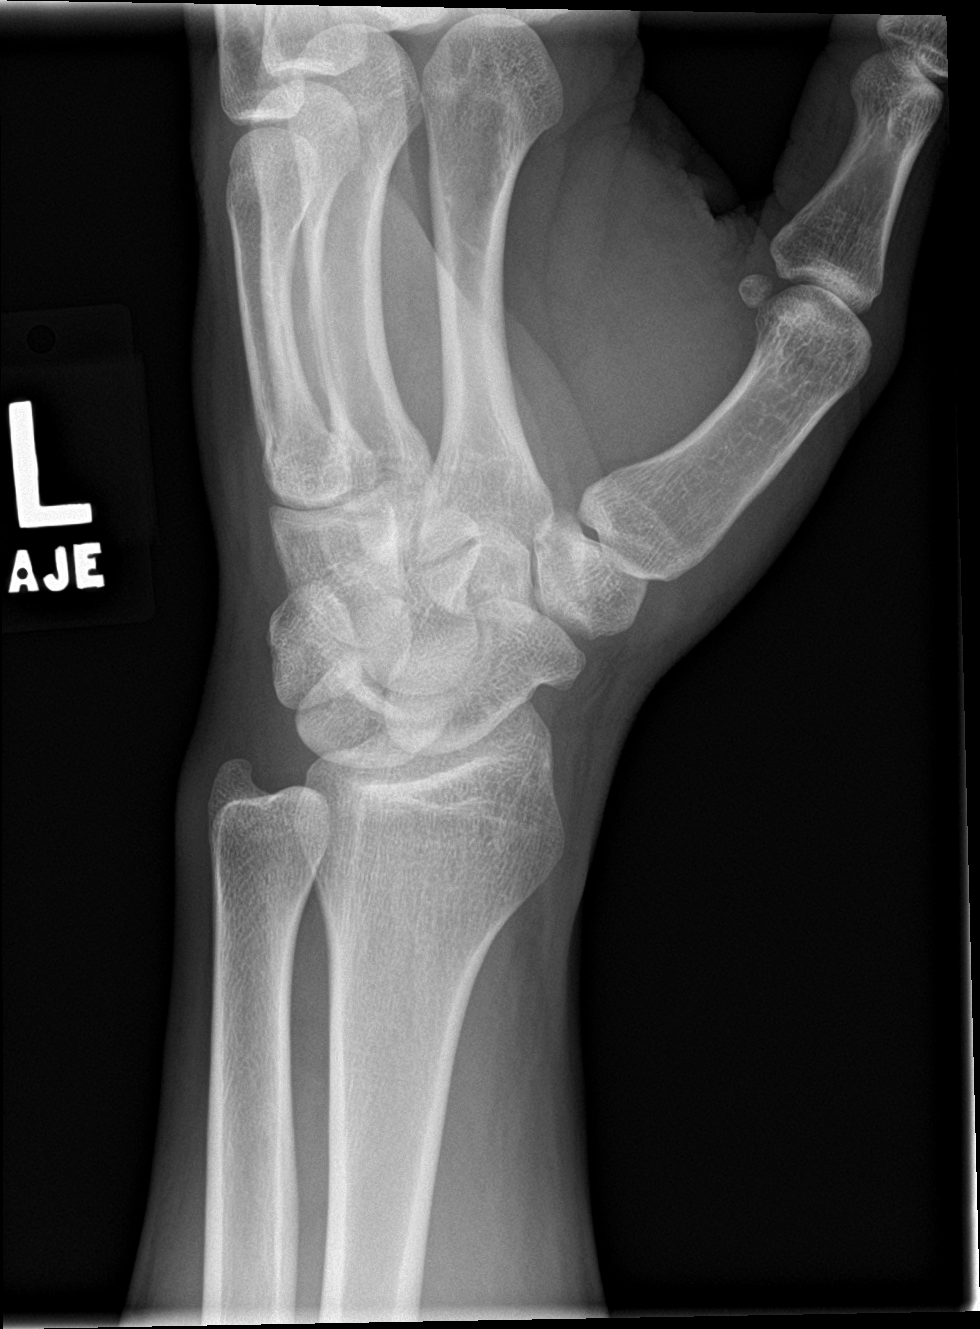

[wrist lat]
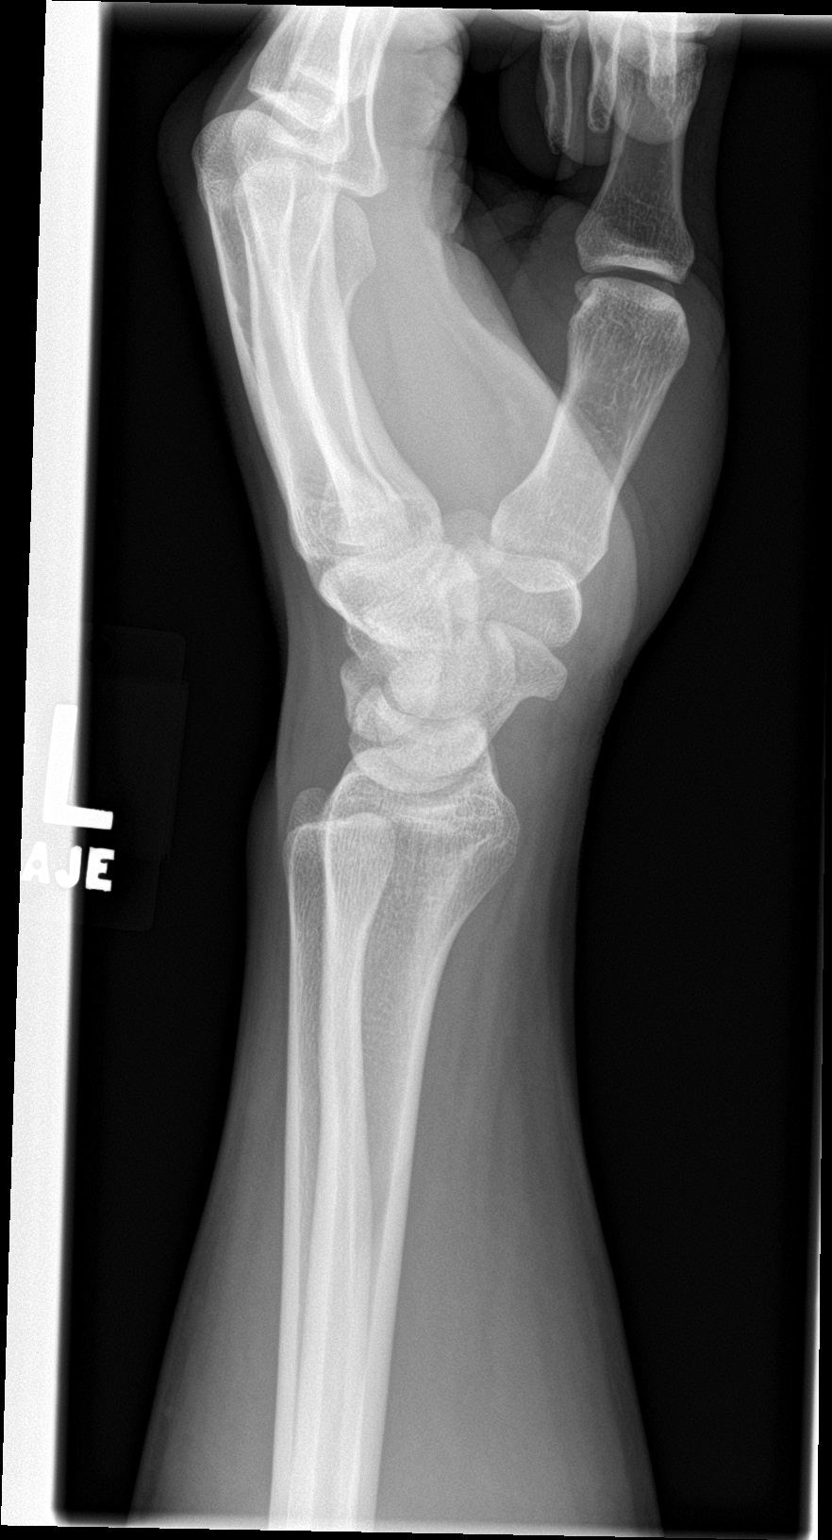

[navicular]
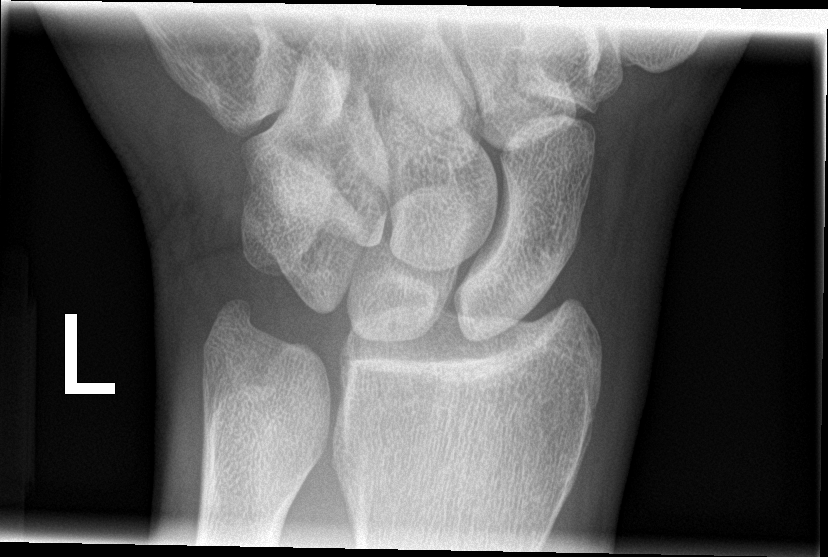

[4 of 4 positions shown; findings below may reference images not displayed]

FINDINGS: The bones of the wrist are well mineralized. The joint spaces are
preserved. There is no acute or old fracture or dislocation. The
soft tissues exhibit no acute abnormalities.
IMPRESSION: There is no acute or chronic bony or soft tissue abnormality of the
left wrist.

## 2019-06-27 ENCOUNTER — Ambulatory Visit: Payer: 59 | Admitting: Physician Assistant

## 2019-07-15 NOTE — Progress Notes (Signed)
Patient: Andrew Montoya Male    DOB: [birth year]   28 y.o.   MRN: [id number] Visit Date: 07/18/2019  Today's Provider: Mar Daring, PA-C   Chief Complaint  Patient presents with  . Shortness of Breath   Subjective:     HPI  Patient here with c/o chest pain when working out, only on the treadmill. This has only happened twice and was in April to May of 2020. He reports pain was on the right side of chest and was tender to palpation. He continues to exercise and is physically fit and does not have any chest pain or SOB with all other exercise.   Knot: He has a knot in his back. He also has some moles he wants to be checked out for or refer to Dermatology.  He is having pain on the top of his right hand. Intermittent. When it is bothering him it can be as bad as an 8/10. Reports flares after hyperextension exercises like push ups and power clean/press.   He also has some skin tags that he would like to have remove under his armpits.  He also c/o of sounding congested after eating. Reports most often happens with [place] food and beer.   No Known Allergies   Current Outpatient Medications:  Marland Kitchen  Multiple Vitamins-Minerals (MENS MULTIVITAMIN PLUS PO), Take by mouth., Disp: , Rfl:   Review of Systems  Constitutional: Negative.   HENT: Negative.   Respiratory: Negative.   Cardiovascular: Negative.   Musculoskeletal: Positive for arthralgias (right wrist).  Skin:       "knot" in back wife thinks is getting bigger (he has no symptoms from it)  Skin tags in arm pits    Social History   Tobacco Use  . Smoking status: Never Smoker  . Smokeless tobacco: Former Network engineer Use Topics  . Alcohol use: Yes    Alcohol/week: 2.0 standard drinks    Types: 2 Cans of beer per week    Comment: 2-3x week      Objective:   BP 134/82 (BP Location: Left Arm, Patient Position: Sitting, Cuff Size: Large)   Pulse 79   Temp 97.8 F (36.6 C) (Temporal)   Resp 16   Ht  5\' 10"  (1.778 m)   Wt 225 lb (102.1 kg)   BMI 32.28 kg/m  Vitals:   07/18/19 0725  BP: 134/82  Pulse: 79  Resp: 16  Temp: 97.8 F (36.6 C)  TempSrc: Temporal  Weight: 225 lb (102.1 kg)  Height: 5\' 10"  (1.778 m)  Body mass index is 32.28 kg/m.   Physical Exam Vitals reviewed.  Constitutional:      General: He is not in acute distress.    Appearance: Normal appearance. He is well-developed and normal weight. He is not ill-appearing.  HENT:     Head: Normocephalic and atraumatic.  Eyes:     General: No scleral icterus.    Extraocular Movements: Extraocular movements intact.     Conjunctiva/sclera: Conjunctivae normal.  Cardiovascular:     Rate and Rhythm: Normal rate.     Pulses: Normal pulses.     Heart sounds: No murmur.  Pulmonary:     Effort: Pulmonary effort is normal. No respiratory distress.  Musculoskeletal:     Right wrist: No swelling, deformity, tenderness, bony tenderness, snuff box tenderness or crepitus. Normal range of motion. Normal pulse.     Left wrist: Normal.     Cervical back: Normal range  of motion and neck supple.     Comments: On the right wrist over the lunate bone in flexion there feels as there may be a small cyst. This may reflect a ganglion cyst and may become more inflamed and irritated during exercises with deep extension  Skin:         Comments: One larger skin tag in left axilla; 2 smaller ones in the right  Soft, slightly mobile, non-tender, well-circumscribed nodule in the mid thoracic back area. No overlying skin changes  Neurological:     Mental Status: He is alert.  Psychiatric:        Mood and Affect: Mood normal.        Behavior: Behavior normal.        Thought Content: Thought content normal.        Judgment: Judgment normal.      No results found for any visits on 07/18/19.     Assessment & Plan    1. Gustatory rhinitis Suspect the "congestion" with certain foods is gustatory rhinitis. Discussed using Atrovent nasal  spray 30 min to 1 hr before eating trigger foods to lessen severity.  - ipratropium (ATROVENT) 0.03 % nasal spray; Use 1 hour before eating trigger foods  Dispense: 30 mL; Refill: 12  2. Right wrist pain Will get xray as below to r/o bony abnormality. Suspect a cyst (possibly ganglion) between the lunate and ulnar/radius bones. Will give meloxicam as below for prn use. Will f/u pending results.  - DG Wrist Complete Right; Future - meloxicam (MOBIC) 15 MG tablet; Take 1 tablet (15 mg total) by mouth daily.  Dispense: 30 tablet; Refill: 5  3. Skin tag Patient did not want to have skin tags cut off and preferred for them to be froze off. Dermatology referral placed for this and the cyst in the mid back region. He may prefer removal. Also wants to establish for routine skin checks.  - Ambulatory referral to Dermatology  4. Dermoid cyst See above medical treatment plan. - Ambulatory referral to Dermatology     Mar Daring, PA-C  [facility]

## 2019-07-18 ENCOUNTER — Other Ambulatory Visit: Payer: Self-pay

## 2019-07-18 ENCOUNTER — Ambulatory Visit
Admission: RE | Admit: 2019-07-18 | Discharge: 2019-07-18 | Disposition: A | Discharge status: Home / Self Care | Payer: 59 | Attending: Physician Assistant | Admitting: Physician Assistant

## 2019-07-18 ENCOUNTER — Encounter: Payer: Self-pay | Admitting: Physician Assistant

## 2019-07-18 ENCOUNTER — Ambulatory Visit
Admission: RE | Admit: 2019-07-18 | Discharge: 2019-07-18 | Disposition: A | Discharge status: Home / Self Care | Payer: 59 | Source: Ambulatory Visit | Attending: Physician Assistant | Admitting: Physician Assistant

## 2019-07-18 ENCOUNTER — Telehealth: Payer: Self-pay

## 2019-07-18 ENCOUNTER — Ambulatory Visit: Payer: 59 | Admitting: Physician Assistant

## 2019-07-18 VITALS — BP 134/82 | HR 79 | Temp 97.8°F | Resp 16 | Ht 70.0 in | Wt 225.0 lb

## 2019-07-18 DIAGNOSIS — M25531 Pain in right wrist: Secondary | ICD-10-CM | POA: Insufficient documentation

## 2019-07-18 DIAGNOSIS — D369 Benign neoplasm, unspecified site: Secondary | ICD-10-CM

## 2019-07-18 DIAGNOSIS — L918 Other hypertrophic disorders of the skin: Secondary | ICD-10-CM | POA: Diagnosis not present

## 2019-07-18 DIAGNOSIS — J310 Chronic rhinitis: Secondary | ICD-10-CM

## 2019-07-18 MED ORDER — MELOXICAM 15 MG PO TABS: 15.0000 mg | ORAL_TABLET | Freq: Every day | ORAL | 5 refills | Status: AC

## 2019-07-18 MED ORDER — IPRATROPIUM BROMIDE 0.03 % NA SOLN: NASAL | 12 refills | Status: AC

## 2019-07-18 NOTE — Telephone Encounter (Signed)
Patient advised on results of wrist xray.

## 2019-07-18 NOTE — Patient Instructions (Signed)
Gustatory rhinitis   Wrist Pain, Adult There are many things that can cause wrist pain. Some common causes include:  An injury to the wrist area, such as a sprain, strain, or fracture.  Overuse of the joint.  A condition that causes increased pressure on a nerve in the wrist (carpal tunnel syndrome).  Wear and tear of the joints that occurs with aging (osteoarthritis).  A variety of other types of arthritis. Sometimes, the cause of wrist pain is not known. Often, the pain goes away when you follow instructions from your health care provider for relieving pain at home, such as resting or icing the wrist. If your wrist pain continues, it is important to tell your health care provider. Follow these instructions at home:  Rest the wrist area for at least 48 hours or as long as told by your health care provider.  If a splint or elastic bandage has been applied, use it as told by your health care provider. ? Remove the splint or bandage only as told by your health care provider. ? Loosen the splint or bandage if your fingers tingle, become numb, or turn cold or blue.  If directed, apply ice to the injured area. ? If you have a removable splint or elastic bandage, remove it as told by your health care provider. ? Put ice in a plastic bag. ? Place a towel between your skin and the bag or between your splint or bandage and the bag. ? Leave the ice on for 20 minutes, 2-3 times a day.   Keep your arm raised (elevated) above the level of your heart while you are sitting or lying down.  Take over-the-counter and prescription medicines only as told by your health care provider.  Keep all follow-up visits as told by your health care provider. This is important. Contact a health care provider if:  You have a sudden sharp pain in the wrist, hand, or arm that is different or new.  The swelling or bruising on your wrist or hand gets worse.  Your skin becomes red, gets a rash, or has open  sores.  Your pain does not get better or it gets worse. Get help right away if:  You lose feeling in your fingers or hand.  Your fingers turn white, very red, or cold and blue.  You cannot move your fingers.  You have a fever or chills. This information is not intended to replace advice given to you by your health care provider. Make sure you discuss any questions you have with your health care provider. Document Revised: 05/11/2017 Document Reviewed: 12/16/2015 Elsevier Patient Education  [place].

## 2023-05-24 ENCOUNTER — Emergency Department: Payer: 59

## 2023-05-24 ENCOUNTER — Other Ambulatory Visit: Payer: Self-pay

## 2023-05-24 ENCOUNTER — Emergency Department
Admission: EM | Admit: 2023-05-24 | Discharge: 2023-05-24 | Disposition: A | Discharge status: Home / Self Care | Payer: 59 | Attending: Emergency Medicine | Admitting: Emergency Medicine

## 2023-05-24 DIAGNOSIS — R0789 Other chest pain: Secondary | ICD-10-CM | POA: Insufficient documentation

## 2023-05-24 DIAGNOSIS — B974 Respiratory syncytial virus as the cause of diseases classified elsewhere: Secondary | ICD-10-CM | POA: Insufficient documentation

## 2023-05-24 DIAGNOSIS — J069 Acute upper respiratory infection, unspecified: Secondary | ICD-10-CM | POA: Insufficient documentation

## 2023-05-24 DIAGNOSIS — B338 Other specified viral diseases: Secondary | ICD-10-CM

## 2023-05-24 DIAGNOSIS — Z1152 Encounter for screening for COVID-19: Secondary | ICD-10-CM | POA: Diagnosis not present

## 2023-05-24 DIAGNOSIS — R519 Headache, unspecified: Secondary | ICD-10-CM

## 2023-05-24 LAB — CBC WITH DIFFERENTIAL/PLATELET
Abs Immature Granulocytes: 0.02 10*3/uL (ref 0.00–0.07)
Abs Immature Granulocytes: 0.02 10*3/uL (ref 0.00–0.07)
Basophils Absolute: 0.1 10*3/uL (ref 0.0–0.1)
Basophils Absolute: 0.1 10*3/uL (ref 0.0–0.1)
Basophils Relative: 1 %
Basophils Relative: 1 %
Eosinophils Absolute: 0.1 10*3/uL (ref 0.0–0.5)
Eosinophils Absolute: 0.1 10*3/uL (ref 0.0–0.5)
Eosinophils Relative: 2 %
Eosinophils Relative: 1 %
HCT: 44.8 % (ref 39.0–52.0)
HCT: 45 % (ref 39.0–52.0)
Hemoglobin: 14.8 g/dL (ref 13.0–17.0)
Hemoglobin: 14.9 g/dL (ref 13.0–17.0)
Immature Granulocytes: 0 %
Immature Granulocytes: 0 %
Lymphocytes Relative: 38 %
Lymphocytes Relative: 39 %
Lymphs Abs: 3.6 10*3/uL (ref 0.7–4.0)
Lymphs Abs: 2.8 10*3/uL (ref 0.7–4.0)
MCH: 28.4 pg (ref 26.0–34.0)
MCH: 28.3 pg (ref 26.0–34.0)
MCHC: 33 g/dL (ref 30.0–36.0)
MCHC: 33.1 g/dL (ref 30.0–36.0)
MCV: 86 fL (ref 80.0–100.0)
MCV: 85.6 fL (ref 80.0–100.0)
Monocytes Absolute: 0.7 10*3/uL (ref 0.1–1.0)
Monocytes Absolute: 0.4 10*3/uL (ref 0.1–1.0)
Monocytes Relative: 7 %
Monocytes Relative: 6 %
Neutro Abs: 5.1 10*3/uL (ref 1.7–7.7)
Neutro Abs: 3.8 10*3/uL (ref 1.7–7.7)
Neutrophils Relative %: 52 %
Neutrophils Relative %: 53 %
Platelets: 230 10*3/uL (ref 150–400)
Platelets: 226 10*3/uL (ref 150–400)
RBC: 5.21 MIL/uL (ref 4.22–5.81)
RBC: 5.26 MIL/uL (ref 4.22–5.81)
RDW: 13 % (ref 11.5–15.5)
RDW: 12.9 % (ref 11.5–15.5)
WBC: 9.6 10*3/uL (ref 4.0–10.5)
WBC: 7.2 10*3/uL (ref 4.0–10.5)
nRBC: 0 % (ref 0.0–0.2)
nRBC: 0 % (ref 0.0–0.2)

## 2023-05-24 LAB — TROPONIN I (HIGH SENSITIVITY)
Troponin I (High Sensitivity): 3 ng/L (ref <18)
Troponin I (High Sensitivity): <2 ng/L (ref <18)

## 2023-05-24 LAB — RESP PANEL BY RT-PCR (RSV, FLU A&B, COVID)  RVPGX2
Influenza A by PCR: NEGATIVE
Influenza B by PCR: NEGATIVE
Resp Syncytial Virus by PCR: POSITIVE — AB
SARS Coronavirus 2 by RT PCR: NEGATIVE

## 2023-05-24 LAB — BASIC METABOLIC PANEL
Anion gap: 8 (ref 5–15)
BUN: 17 mg/dL (ref 6–20)
CO2: 28 mmol/L (ref 22–32)
Calcium: 9 mg/dL (ref 8.9–10.3)
Chloride: 99 mmol/L (ref 98–111)
Creatinine, Ser: 0.86 mg/dL (ref 0.61–1.24)
GFR, Estimated: >60 mL/min (ref >60)
Glucose, Bld: 99 mg/dL (ref 70–99)
Potassium: 3.9 mmol/L (ref 3.5–5.1)
Sodium: 135 mmol/L (ref 135–145)

## 2023-05-24 LAB — COMPREHENSIVE METABOLIC PANEL
ALT: 42 U/L (ref 0–44)
AST: 26 U/L (ref 15–41)
Albumin: 4.2 g/dL (ref 3.5–5.0)
Alkaline Phosphatase: 67 U/L (ref 38–126)
Anion gap: 8 (ref 5–15)
BUN: 18 mg/dL (ref 6–20)
CO2: 28 mmol/L (ref 22–32)
Calcium: 9.1 mg/dL (ref 8.9–10.3)
Chloride: 101 mmol/L (ref 98–111)
Creatinine, Ser: 0.86 mg/dL (ref 0.61–1.24)
GFR, Estimated: >60 mL/min (ref >60)
Glucose, Bld: 109 mg/dL — ABNORMAL HIGH (ref 70–99)
Potassium: 4.2 mmol/L (ref 3.5–5.1)
Sodium: 137 mmol/L (ref 135–145)
Total Bilirubin: 1 mg/dL (ref <1.2)
Total Protein: 7.4 g/dL (ref 6.5–8.1)

## 2023-05-24 MED ORDER — KETOROLAC TROMETHAMINE 30 MG/ML IJ SOLN
30.0000 mg | Freq: Once | INTRAMUSCULAR | Status: AC
  Administered 2023-05-24: 30 mg via INTRAVENOUS
  Filled 2023-05-24: qty 1

## 2023-05-24 MED ORDER — SODIUM CHLORIDE 0.9 % IV BOLUS (SEPSIS)
1000.0000 mL | Freq: Once | INTRAVENOUS | Status: AC
  Administered 2023-05-24: 1000 mL via INTRAVENOUS

## 2023-05-24 MED ORDER — IOHEXOL 350 MG/ML SOLN
75.0000 mL | Freq: Once | INTRAVENOUS | Status: AC | PRN
  Administered 2023-05-24: 75 mL via INTRAVENOUS

## 2023-05-24 NOTE — Discharge Instructions (Addendum)
You may alternate Tylenol 1000 mg every 6 hours as needed for pain, fever and Ibuprofen 800 mg every 6-8 hours as needed for pain, fever.  Please take Ibuprofen with food.  Do not take more than 4000 mg of Tylenol (acetaminophen) in a 24 hour period. ° °

## 2023-05-24 NOTE — ED Triage Notes (Signed)
See paper chart for downtime charting 

## 2023-05-24 NOTE — ED Provider Notes (Signed)
[facility] Provider Note    Event Date/Time   First MD Initiated Contact with Patient 05/24/23 [phone]     (approximate)   History   Headache and Chest Pain   HPI  Andrew Montoya is a 32 y.o. male with no significant past medical history who presents to the emergency department with complaints of severe headache that started when he woke up at 11:15 PM.  He states it woke him from sleep.  Headache is located on the right side of the head just behind the eye.  He describes it as a throbbing pain that has improved to a dull ache.  States he does not have a history of chronic headaches.  Denies any head injury.  Denies numbness, tingling, weakness, vomiting, vision changes.  He states he did have to take Sudafed earlier today because he was having some sinus congestion and reports his son was recently diagnosed with enterovirus and RSV.  He has not had any cough or fever.  He states after the headache started he developed left-sided chest pain.  He did have some tingling on the left arm.  No history of ACS.  No history of PE, DVT, exogenous estrogen use, recent fractures, surgery, trauma, hospitalization, prolonged travel or other immobilization. No lower extremity swelling or pain. No calf tenderness.  States he took Aleve at home prior to coming and and this is already improved as well.   History provided by patient.    No past medical history on file.  Past Surgical History:  Procedure Laterality Date   TONSILLECTOMY Bilateral 03/16/2017   Procedure: TONSILLECTOMY;  Surgeon: Linus Salmons, MD;  Location: [facility];  Service: ENT;  Laterality: Bilateral;   WISDOM TOOTH EXTRACTION      MEDICATIONS:  Prior to Admission medications   Medication Sig Start Date End Date Taking? Authorizing Provider  ipratropium (ATROVENT) 0.03 % nasal spray Use 1 hour before eating trigger foods 07/18/19   Margaretann Loveless, PA-C  meloxicam (MOBIC) 15 MG tablet  Take 1 tablet (15 mg total) by mouth daily. 07/18/19   Margaretann Loveless, PA-C  Multiple Vitamins-Minerals (MENS MULTIVITAMIN PLUS PO) Take by mouth.    [provider]    Physical Exam   Triage Vital Signs: ED Triage Vitals  Encounter Vitals Group     BP 05/24/23 0334 129/78     Systolic BP Percentile --      Diastolic BP Percentile --      Pulse Rate 05/24/23 0334 66     Resp 05/24/23 0334 19     Temp 05/24/23 0334 97.7 F (36.5 C)     Temp Source 05/24/23 0334 Oral     SpO2 05/24/23 0334 99 %     Weight --      Height --      Head Circumference --      Peak Flow --      Pain Score 05/24/23 0335 1     Pain Loc --      Pain Education --      Exclude from Growth Chart --     Most recent vital signs: Vitals:   05/24/23 0445 05/24/23 0516  BP:    Pulse: 71 68  Resp: 18 20  Temp:    SpO2:      CONSTITUTIONAL: Alert, responds appropriately to questions. Well-appearing; well-nourished HEAD: Normocephalic, atraumatic EYES: Conjunctivae clear, pupils appear equal, sclera nonicteric ENT: normal nose; moist mucous membranes NECK: Supple,  normal ROM, no meningismus CARD: RRR; S1 and S2 appreciated RESP: Normal chest excursion without splinting or tachypnea; breath sounds clear and equal bilaterally; no wheezes, no rhonchi, no rales, no hypoxia or respiratory distress, speaking full sentences ABD/GI: Non-distended; soft, non-tender, no rebound, no guarding, no peritoneal signs BACK: The back appears normal EXT: Normal ROM in all joints; no deformity noted, no edema, no calf tenderness or calf swelling SKIN: Normal color for age and race; warm; no rash on exposed skin NEURO: Moves all extremities equally, normal speech normal sensation, cranial nerves II through XII intact, normal gait PSYCH: The patient's mood and manner are appropriate.   ED Results / Procedures / Treatments   LABS: (all labs ordered are listed, but only abnormal results are displayed) Labs  Reviewed  RESP PANEL BY RT-PCR (RSV, FLU A&B, COVID)  RVPGX2 - Abnormal; Notable for the following components:      Result Value   Resp Syncytial Virus by PCR POSITIVE (*)    All other components within normal limits  CBC WITH DIFFERENTIAL/PLATELET  BASIC METABOLIC PANEL  TROPONIN I (HIGH SENSITIVITY)     EKG:  EKG Interpretation Date/Time:  Thursday May 24 2023 01:34:04 EST Ventricular Rate:  65 PR Interval:  162 QRS Duration:  80 QT Interval:  368 QTC Calculation: 382 R Axis:   46  Text Interpretation: Normal sinus rhythm Normal ECG No previous ECGs available Confirmed by Rochele Raring [id number]) on 05/24/2023 3:47:29 AM         RADIOLOGY: My personal review and interpretation of imaging: Chest x-ray clear.  CTA head unremarkable.  I have personally reviewed all radiology reports.   CT Angio Head W or Wo Contrast Result Date: 05/24/2023 CLINICAL DATA:  Woke up with severe headache last night.2 EXAM: CT ANGIOGRAPHY HEAD TECHNIQUE: Multidetector CT imaging of the head was performed using the standard protocol during bolus administration of intravenous contrast. Multiplanar CT image reconstructions and MIPs were obtained to evaluate the vascular anatomy. RADIATION DOSE REDUCTION: This exam was performed according to the departmental dose-optimization program which includes automated exposure control, adjustment of the mA and/or kV according to patient size and/or use of iterative reconstruction technique. CONTRAST:  75mL OMNIPAQUE IOHEXOL 350 MG/ML SOLN COMPARISON:  01/18/2014 FINDINGS: CT HEAD Brain: No evidence of acute infarction, hemorrhage, hydrocephalus, extra-axial collection or mass lesion/mass effect. Vascular: No hyperdense vessel or unexpected calcification. Skull: Normal. Negative for fracture or focal lesion. Sinuses: No acute or significant finding. CTA HEAD Anterior circulation: No significant stenosis, proximal occlusion, aneurysm, or vascular malformation.  Posterior circulation: No significant stenosis, proximal occlusion, aneurysm, or vascular malformation. Venous sinuses: As permitted by contrast timing, patent. Review of the MIP images confirms the above findings. IMPRESSION: Normal CTA. Electronically Signed   By: Tiburcio Pea M.D.   On: 05/24/2023 04:34   DG Chest 2 View Result Date: 05/24/2023 CLINICAL DATA:  Chest pain EXAM: CHEST - 2 VIEW COMPARISON:  05/02/2016 FINDINGS: The heart size and mediastinal contours are within normal limits. Both lungs are clear. The visualized skeletal structures are unremarkable. IMPRESSION: No active cardiopulmonary disease. Electronically Signed   By: Alcide Clever M.D.   On: 05/24/2023 02:27     PROCEDURES:  Critical Care performed: No      Procedures    IMPRESSION / MDM / ASSESSMENT AND PLAN / ED COURSE  I reviewed the triage vital signs and the nursing notes.    Patient here with sudden onset headache and chest pain.  DIFFERENTIAL DIAGNOSIS (includes but not limited to):   Cranial hemorrhage, aneurysm, sinus headache, viral illness, doubt stroke, meningitis, CVT  This pain may be secondary to anxiety.  Low suspicion for ACS, PE, dissection, pneumonia, pneumothorax.   Patient's presentation is most consistent with acute presentation with potential threat to life or bodily function.   PLAN: Please see downtime forms.  Labs obtained from triage.  Hemoglobin 14.8.  Creatinine 0.86.  Normal electrolytes.  Glucose 109.  Initial troponin is 3.  Chest x-ray reviewed and interpreted by myself and radiologist and is clear.  EKG nonischemic.  Will obtain COVID and flu swabs given symptoms of congestion with recent sick contacts.  Will obtain CTA of the head to evaluate for bleed, intracranial hemorrhage.  He is within the 6-hour window.   MEDICATIONS GIVEN IN ED: Medications  sodium chloride 0.9 % bolus 1,000 mL (1,000 mLs Intravenous New Bag/Given 05/24/23 0449)  iohexol (OMNIPAQUE) 350  MG/ML injection 75 mL (75 mLs Intravenous Contrast Given 05/24/23 0409)  ketorolac (TORADOL) 30 MG/ML injection 30 mg (30 mg Intravenous Given 05/24/23 0450)     ED COURSE: CTA head reviewed and interpreted by myself and the radiologist and shows no aneurysm, stenosis, occlusion or blood.  Will give Toradol, IV fluids for symptomatic relief.   She reports feeling better after Toradol, fluids.  Repeat troponin less than 2.   Patient tested positive for RSV.  COVID and flu negative.  No bronchiolitis or RSV pneumonia on chest x-ray.  No respiratory distress, hypoxia or increased work of breathing.  Discussed supportive care instructions and return precautions but likely the cause of symptoms today.  Patient comfortable with plan for discharge home.  Advised him to stay out of work for the next 2 to 3 days.   At this time, I do not feel there is any life-threatening condition present. I reviewed all nursing notes, vitals, pertinent previous records.  All lab and urine results, EKGs, imaging ordered have been independently reviewed and interpreted by myself.  I reviewed all available radiology reports from any imaging ordered this visit.  Based on my assessment, I feel the patient is safe to be discharged home without further emergent workup and can continue workup as an outpatient as needed. Discussed all findings, treatment plan as well as usual and customary return precautions.  They verbalize understanding and are comfortable with this plan.  Outpatient follow-up has been provided as needed.  All questions have been answered.    CONSULTS:  none   OUTSIDE RECORDS REVIEWED: Reviewed last internal medicine note on 05/17/2023.       FINAL CLINICAL IMPRESSION(S) / ED DIAGNOSES   Final diagnoses:  Atypical chest pain  Bad headache  RSV (respiratory syncytial virus infection)     Rx / DC Orders   ED Discharge Orders     None        Note:  This document was prepared using Dragon  voice recognition software and may include unintentional dictation errors.   Garry Bochicchio, Layla Maw, DO 05/24/23 [phone]
